# Patient Record
Sex: Male | Born: 1985 | Race: White | Hispanic: No | Marital: Single
Health system: Southern US, Community
[De-identification: ages and names within clinical notes are randomized; demographics above are authoritative.]

---

## 2019-11-07 ENCOUNTER — Other Ambulatory Visit: Payer: Self-pay

## 2019-11-07 ENCOUNTER — Emergency Department (HOSPITAL_COMMUNITY)
Admission: EM | Admit: 2019-11-07 | Discharge: 2019-11-08 | Disposition: A | Discharge status: Home / Self Care | Payer: Self-pay | Attending: Emergency Medicine | Admitting: Emergency Medicine

## 2019-11-07 ENCOUNTER — Encounter (HOSPITAL_COMMUNITY): Payer: Self-pay

## 2019-11-07 ENCOUNTER — Emergency Department (HOSPITAL_COMMUNITY): Payer: Self-pay

## 2019-11-07 DIAGNOSIS — Y9389 Activity, other specified: Secondary | ICD-10-CM | POA: Insufficient documentation

## 2019-11-07 DIAGNOSIS — Y92410 Unspecified street and highway as the place of occurrence of the external cause: Secondary | ICD-10-CM | POA: Insufficient documentation

## 2019-11-07 DIAGNOSIS — S060X0A Concussion without loss of consciousness, initial encounter: Secondary | ICD-10-CM | POA: Insufficient documentation

## 2019-11-07 DIAGNOSIS — S161XXA Strain of muscle, fascia and tendon at neck level, initial encounter: Secondary | ICD-10-CM | POA: Insufficient documentation

## 2019-11-07 DIAGNOSIS — Y999 Unspecified external cause status: Secondary | ICD-10-CM | POA: Insufficient documentation

## 2019-11-07 DIAGNOSIS — F1910 Other psychoactive substance abuse, uncomplicated: Secondary | ICD-10-CM | POA: Insufficient documentation

## 2019-11-07 DIAGNOSIS — R400 Somnolence: Secondary | ICD-10-CM | POA: Insufficient documentation
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
## 2019-11-08 ENCOUNTER — Emergency Department (HOSPITAL_COMMUNITY): Payer: Self-pay

## 2019-11-08 NOTE — Discharge Instructions (Addendum)
Substance Abuse Treatment Programs ° °Intensive Outpatient Programs °[facility]     °[address]      °[place], [place]                   °[phone]      ° °[facility] °[address] °[place], [place] °[phone] ° °[facility]     °(Inpatient and outpatient)     °[address]           °[phone]   ° °[facility] °336-288-1484 (Suboxone and Methadone) ° °[address]      °[place], [place] [address]      °336-882-2125      ° °[address] °[place], [place] °852-3033 ° °[facility] (Outpatient/Inpatient, Chemical)    °(insurance only) [phone]      °       °[facility] (Groups & Residential) °[place], [place] °336-389-1413 ° °   °[facility]     °[address]     °[place], [place]      °[phone]      ° °[facility] (for caregivers and family) °[address] °[place], [place] °336-299-4655 ° ° ° ° ° °Residential Treatment Programs °[facility]      °[address], [place], [place] [address]  °([phone]      ° °[facility] °[address] [place], [place] [address] °[phone] ° °[facility]        °336-248-8914      ° °[facility] °1-800-659-3381 ° °[facility]             °[address]                                         °[place], [place]                                                °877-615-2722 or 336-784-9470                              ° °[facility] [place] °[address] °[place], [address] °1.877.941.8954 ° °[facility]    °[address]      °[place], [place]     °[phone]      ° °[facility] °[address] °[place], [place] °[phone] ° °[facility]   °[address]     °[place], [place] [address]     °[phone]      °Admissions: 8am-3pm M-F ° °[facility] °[address] °[place],  [place] °336-227-7417 ° °[facility]: Residential Program (90 Days)   °[place], [place]      °336-725-8389 or 800-758-6077    ° °[facility] °[place], [place] °(Walk in Hours over the weekend or by referral) ° °[facility] °[address], [place],  [address] °([phone] ° °Crisis Mobile: [facility]:  1-877-626-1772 (for crisis response 24 hours a day) °[facility] Hotline:      1-800-256-2452 °Outpatient Psychiatry and Counseling ° °[facility]: Mobile Crisis   Management 24 hours:  1-877-626-1772 ° °[facility] sliding scale fee and walk in schedule: M-F 8am-12pm/1pm-3pm °[address]  °[place], [place] [address] °[phone] ° °[facility] °[address] °[place], [place] [address] °336-703-9650 ° °[facility] (Former[facility] known [facility])- new patient walk-in appointments available Monday - Friday 8am -3pm.          °[address] °[place], [place] [address] °336-676-6840 or crisis line- 336-676-6905 ° °[facility] Intensive Outpatient Therapy Program °[address] °[place], [place] [address] °336-832-9804 ° °[facility]                  °Crisis Services      °[phone]      °[address]     °[place], [place] [address]                ° °[facility]   °[facility] °[phone] °[address] °[place], [place] [address] ° ° °[facility]          °[address],  °[place], [place] [address]       °[phone] ° °[facility] °[address] °[place], [place] [address] °336-292-1510 ° °[facility]    °[address]    °[place], [place] [address]     °[phone]      ° °Parish McKinney, MD     °[address]     °[place] [address]     °336-282-1251     °  °[facility] °[address] °[place] [address] ° °[facility]     °[address]     °[place], [place]      °336-542-2076      ° °[facility] °Shamsher Ahluwalia, MD °[address] °[place], [place] [address] °336-420-9558 ° °[facility]     °[address]     °[place], [place] [address]     °336-274-1237      ° °[facility] °[address] °[place], [place] [address] °336-854-4450 °Resources for Temporary Residential Assistance/Crisis Centers ° °DAY CENTERS °[facility] °M-F 8am-3pm   °[address] [place], [place] [address]   336-332-0824 °Services include: laundry, barbering, support groups, case management, phone  & computer access, showers, AA/NA mtgs, mental health/substance abuse nurse, job skills class, disability information, VA assistance, spiritual classes, etc.  ° °HOMELESS SHELTERS ° °[facility]     °[facility]   °[address], [place]     °[phone]       °       °[facility] (women and children)       °[address] °[place], [place] [address] °336-275-0820 °[email or web address] for application and process °Application Required ° °[facility]   °[address]    °[place] [address]     °[phone]       °             °[facility] °[address] °,  [address] °336.273.5572 °336-235-0363(schedule application appt.) °Application Required ° °[facility] (women only)    °[address]     °[place],  [address]     °336-884-1039      °  Intake starts 6pm daily °Need valid ID, SSC, & Police report °[facility] [place] °[address] °[place], [place] °336-881-5420 °Application Required ° °[facility] (men only)     °[address]      °[place], [place]     °[phone]      ° °[facility] °(Pregnant women only) °[address] °[place], [place] °336-275-0206 ° °[facility]      °[address]      °[place], [place] [address]     °[phone]      °       °[facility] °[address] °[place], [place] °336-723-1848 °90 day commitment/SA/Application process ° °[facility](men only)     °[address]     °[place], [place]     °[phone]       °Check-in at 7pm     °       °[facility] °[address] °[place], [place] [address] °336-248-6684 °Men/Women/Women and Children must be there by 7 pm ° °[facility] °[place], [place] °336-722-8721                ° °
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
